# Patient Record
Sex: Male | Born: 1950 | Race: White | Hispanic: No | Marital: Married
Health system: Southern US, Community
[De-identification: ages and names within clinical notes are randomized; demographics above are authoritative.]

## PROBLEM LIST (undated history)

## (undated) HISTORY — PX: FOOT SURGERY: SHX648

## (undated) HISTORY — PX: OTHER SURGICAL HISTORY: SHX169

## (undated) HISTORY — PX: HIP FRACTURE SURGERY: SHX118

---

## 2011-11-09 ENCOUNTER — Emergency Department (HOSPITAL_COMMUNITY): Payer: BC Managed Care – PPO

## 2011-11-09 ENCOUNTER — Encounter (HOSPITAL_COMMUNITY): Payer: Self-pay | Admitting: *Deleted

## 2011-11-09 ENCOUNTER — Emergency Department (HOSPITAL_COMMUNITY)
Admission: EM | Admit: 2011-11-09 | Discharge: 2011-11-09 | Disposition: A | Discharge status: Home / Self Care | Payer: BC Managed Care – PPO | Attending: Emergency Medicine | Admitting: Emergency Medicine

## 2011-11-09 DIAGNOSIS — N1330 Unspecified hydronephrosis: Secondary | ICD-10-CM | POA: Insufficient documentation

## 2011-11-09 DIAGNOSIS — M545 Low back pain, unspecified: Secondary | ICD-10-CM | POA: Insufficient documentation

## 2011-11-09 DIAGNOSIS — R1032 Left lower quadrant pain: Secondary | ICD-10-CM | POA: Insufficient documentation

## 2011-11-09 DIAGNOSIS — R112 Nausea with vomiting, unspecified: Secondary | ICD-10-CM | POA: Insufficient documentation

## 2011-11-09 DIAGNOSIS — N201 Calculus of ureter: Secondary | ICD-10-CM | POA: Insufficient documentation

## 2011-11-09 LAB — POCT I-STAT, CHEM 8
BUN: 18 mg/dL (ref 6–23)
Calcium, Ion: 1.14 mmol/L (ref 1.12–1.23)
Creatinine, Ser: 1.1 mg/dL (ref 0.50–1.35)
Hemoglobin: 15.6 g/dL (ref 13.0–17.0)
Sodium: 139 mEq/L (ref 135–145)
TCO2: 25 mmol/L (ref 0–100)

## 2011-11-09 LAB — URINE MICROSCOPIC-ADD ON

## 2011-11-09 LAB — URINALYSIS, ROUTINE W REFLEX MICROSCOPIC
Glucose, UA: NEGATIVE mg/dL
Ketones, ur: NEGATIVE mg/dL
Leukocytes, UA: NEGATIVE
Protein, ur: NEGATIVE mg/dL
Urobilinogen, UA: 0.2 mg/dL (ref 0.0–1.0)

## 2011-11-09 MED ORDER — NAPROXEN 500 MG PO TABS: 500.0000 mg | ORAL_TABLET | Freq: Two times a day (BID) | ORAL | Status: AC

## 2011-11-09 MED ORDER — KETOROLAC TROMETHAMINE 15 MG/ML IJ SOLN
15.0000 mg | Freq: Once | INTRAMUSCULAR | Status: AC
  Administered 2011-11-09: 15 mg via INTRAVENOUS
  Filled 2011-11-09: qty 1

## 2011-11-09 MED ORDER — TAMSULOSIN HCL 0.4 MG PO CAPS: 0.4000 mg | ORAL_CAPSULE | Freq: Every day | ORAL | Status: AC

## 2011-11-09 MED ORDER — HYDROMORPHONE HCL PF 1 MG/ML IJ SOLN
1.0000 mg | Freq: Once | INTRAMUSCULAR | Status: AC
  Administered 2011-11-09: 1 mg via INTRAVENOUS

## 2011-11-09 MED ORDER — ONDANSETRON HCL 4 MG/2ML IJ SOLN
4.0000 mg | Freq: Once | INTRAMUSCULAR | Status: AC
  Administered 2011-11-09: 4 mg via INTRAVENOUS

## 2011-11-09 MED ORDER — OXYCODONE-ACETAMINOPHEN 5-325 MG PO TABS: 1.0000 | ORAL_TABLET | Freq: Four times a day (QID) | ORAL | Status: AC | PRN

## 2011-11-09 NOTE — ED Provider Notes (Signed)
History     CSN: [id number]  Arrival date & time 11/09/11  4098   First MD Initiated Contact with Patient 11/09/11 0630      Chief Complaint  Patient presents with  . Abdominal Pain    (Consider location/radiation/quality/duration/timing/severity/associated sxs/prior treatment) HPI Comments: Patient presents with complaint of left lower back pain that began acutely at 0330. Pain described as sharp and constant. Since that time the pain as moved to L lower abdomen. No pain in L groin. Pain has been associated with N/V. Vomit is non-bloody, non-bilious. No treatments prior to arrival. No fever, diarrhea. No h/o kidney stones, diverticulitis. No h/o abdominal surgery. Nothing makes symptoms better or worse.   The history is provided by the patient.    History reviewed. No pertinent past medical history.  Past Surgical History  Procedure Date  . Hip fracture surgery   . Foot surgery   . Arm surgery     History reviewed. No pertinent family history.  History  Substance Use Topics  . Smoking status: Not on file  . Smokeless tobacco: Not on file  . Alcohol Use:      ocassoinally      Review of Systems  Constitutional: Negative for fever.  HENT: Negative for sore throat and rhinorrhea.   Eyes: Negative for redness.  Respiratory: Negative for cough.   Cardiovascular: Negative for chest pain.  Gastrointestinal: Positive for nausea and vomiting. Negative for abdominal pain and diarrhea.  Genitourinary: Positive for flank pain. Negative for dysuria.  Musculoskeletal: Positive for back pain. Negative for myalgias.  Skin: Negative for rash.  Neurological: Negative for headaches.    Allergies  Review of patient's allergies indicates no known allergies.  Home Medications   Current Outpatient Rx  Name Route Sig Dispense Refill  . ADULT MULTIVITAMIN W/MINERALS CH Oral Take 1 tablet by mouth daily.      BP 151/92  Temp 97.9 F (36.6 C) (Oral)  Resp 18  Ht 5\' 10"   (1.778 m)  Wt 172 lb (78.019 kg)  BMI 24.68 kg/m2  SpO2 98%  Physical Exam  Nursing note and vitals reviewed. Constitutional: He appears well-developed and well-nourished.       Patient appears uncomfortable. Unable to sit still.   HENT:  Head: Normocephalic and atraumatic.  Eyes: Conjunctivae normal are normal. Right eye exhibits no discharge. Left eye exhibits no discharge.  Neck: Normal range of motion. Neck supple.  Cardiovascular: Normal rate, regular rhythm and normal heart sounds.   Pulmonary/Chest: Effort normal and breath sounds normal.  Abdominal: Soft. There is no tenderness. There is no rebound, no guarding and no CVA tenderness.  Neurological: He is alert.  Skin: Skin is warm and dry.  Psychiatric: He has a normal mood and affect.    ED Course  Procedures (including critical care time)  Labs Reviewed  URINALYSIS, ROUTINE W REFLEX MICROSCOPIC - Abnormal; Notable for the following:    Color, Urine AMBER (*)  BIOCHEMICALS MAY BE AFFECTED BY COLOR   APPearance HAZY (*)     Specific Gravity, Urine 1.031 (*)     Hgb urine dipstick LARGE (*)     All other components within normal limits  POCT I-STAT, CHEM 8 - Abnormal; Notable for the following:    Glucose, Bld 146 (*)     All other components within normal limits  URINE MICROSCOPIC-ADD ON - Abnormal; Notable for the following:    Crystals CA OXALATE CRYSTALS (*)     All other components within  normal limits   Ct Abdomen Pelvis Wo Contrast  11/09/2011  *RADIOLOGY REPORT*  Clinical Data: Left flank pain.  CT ABDOMEN AND PELVIS WITHOUT CONTRAST  Technique:  Multidetector CT imaging of the abdomen and pelvis was performed following the standard protocol without intravenous contrast.  Comparison: None.  Findings: Mild dependent atelectasis is seen in the lung bases. There is no pleural or pericardial effusion.  The patient has mild left hydronephrosis due to a 0.4 cm stone just proximal to the left UVJ.  No other urinary  tract stones are identified on the right or left.  The gallbladder, liver, spleen, adrenal glands and pancreas appear normal.  The stomach, small and large bowel and appendix appear normal.  There is no lymphadenopathy or fluid.  Bones demonstrate a remote appearing mild superior endplate compression fracture of L1.  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  There is 0.4 cm of anterolisthesis of L4 on L5 and 0.6 cm of anterolisthesis of L5 on S1.  Fixation of an old healed right hip fracture with three screws noted.  IMPRESSION:  1.  Mild left hydronephrosis due to a 0.4 cm distal left ureteral stone. 2.  Bilateral pars particulars defects L3, L4 and L5.  Mild anterolisthesis of L4 on L5 and L5 on S1 noted. 4.  Remote appearing T12 superior endplate compression fracture.   Original Report Authenticated By: Bernadene Bell. D'ALESSIO, M.D.      1. Ureteral stone     6:55 AM Patient seen and examined. Work-up initiated. Medications ordered. CT scan ordered to evaluate L ureteral stone.   Vital signs reviewed and are as follows: Filed Vitals:   11/09/11 0627  BP: 151/92  Temp: 97.9 F (36.6 C)  Resp: 18   7:56 AM Mild left hydronephrosis due to 4mm distal left ureteral stone. Pt informed. Toradol ordered. No contraindications (GI bleed, renal problems, urgent need for urological intervention). Crt 1.1.   Pain improved after dilaudid but states pain returning.   10:00 AM Toradol given, patient feels better. Awaiting UA. Patient has yet to urinate. He is drinking fluids.   12:06 PM UA reviewed. No signs of infection. On reexam patient has complete resolution of pain and he is feeling well. Will discharge to home. Patient urged to return to [place] long if he feels the need to return to the emergency department for pain.  Patient counseled on use of narcotic pain medications. Counseled not to combine these medications with others containing tylenol. Urged not to drink  alcohol, drive, or perform any other activities that requires focus while taking these medications. The patient verbalizes understanding and agrees with the plan.    MDM  4mm L ureteral stone, mild hydronephrosis. No UTI. Pain controlled. Appropriate for outpatient treatment and follow-up.         Renne Crigler, [place] 11/09/11 1209

## 2011-11-09 NOTE — ED Notes (Signed)
Patient stated he was driving to work and started with left lower back pain and nausea.  The pain has traveled around to the left lower abd area and now has been vomiting in a bag.

## 2011-11-09 NOTE — ED Notes (Signed)
Patient presents with c/o lower back pain that has traveled around the front to the lower abd area.  Denies pain in groin, no difficulty urinating.  +heaving

## 2011-11-09 NOTE — ED Notes (Signed)
Pt returned from CT °

## 2011-11-09 NOTE — ED Notes (Signed)
Josh, PA back at the bedside.  

## 2011-11-11 NOTE — ED Provider Notes (Signed)
Medical screening examination/treatment/procedure(s) were performed by non-physician practitioner and as supervising physician I was immediately available for consultation/collaboration.   Alano Blasco L Tishia Maestre, MD 11/11/11 1619 

## 2014-04-13 IMAGING — CT CT ABD-PELV W/O CM
2 of 4 series · 17 of 46 positions shown, 19 images · non-contrast
Comparison: None.

CLINICAL DATA: Left flank pain.

CT ABDOMEN AND PELVIS WITHOUT CONTRAST
TECHNIQUE: Multidetector CT imaging of the abdomen and pelvis was
performed following the standard protocol without intravenous
contrast.

[Series 2: stone 160 5.0 b31f st · axial · 0.76mm/px · z∈[+829,+1279]mm · 14 of 100 slices shown, 16 images]
[im 5/100  soft-tissue]
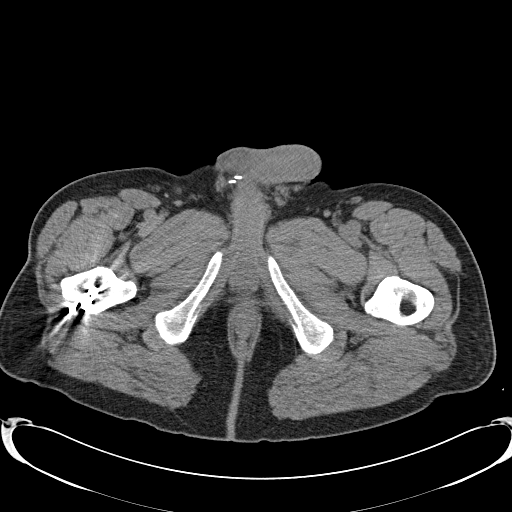
[im 5/100  bone]
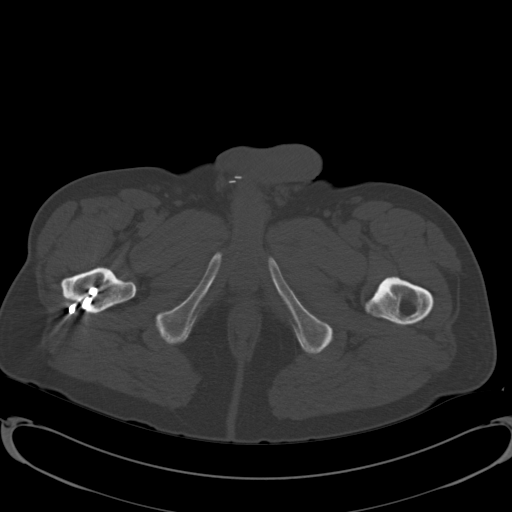
[im 13/100  soft-tissue]
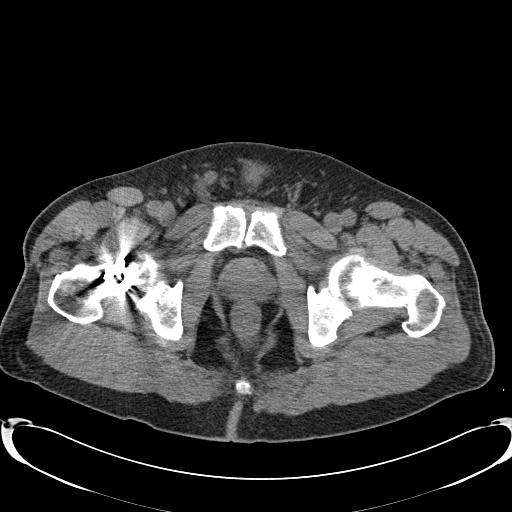
[im 18/100  soft-tissue]
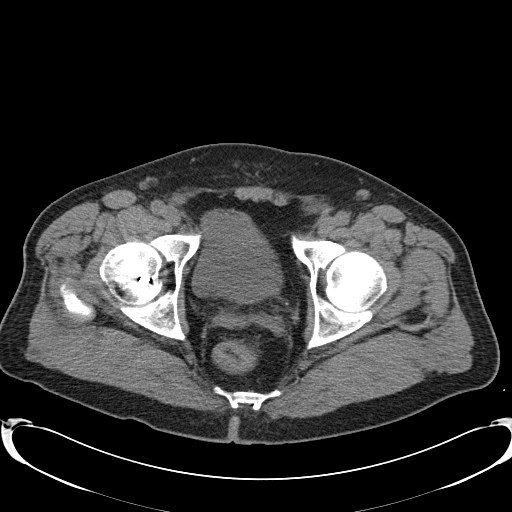
[im 26/100  soft-tissue]
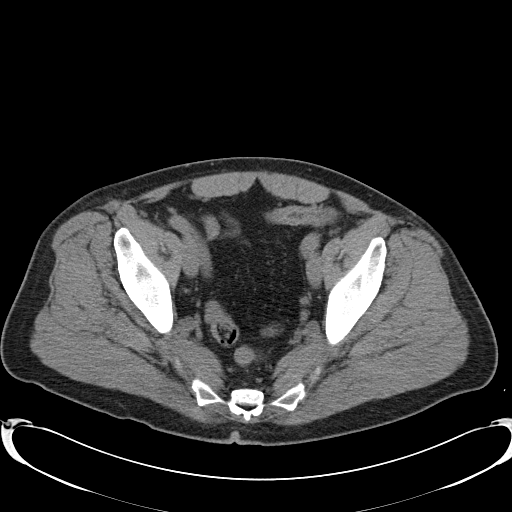
[im 35/100  soft-tissue]
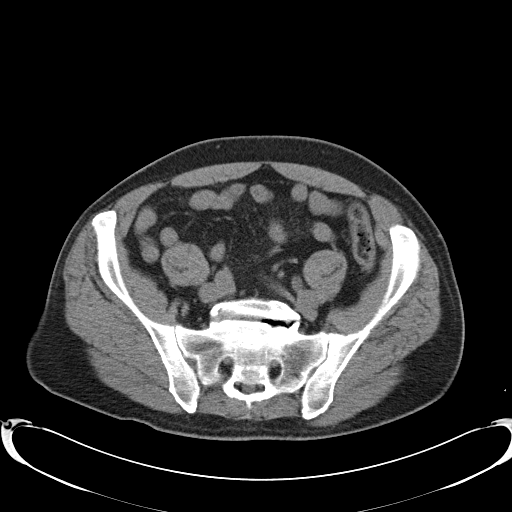
[im 39/100  soft-tissue]
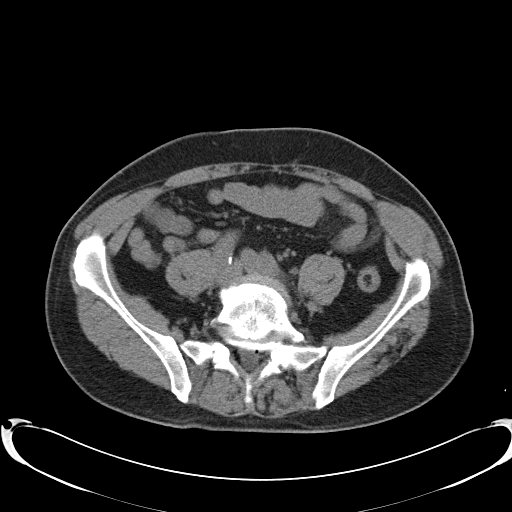
[im 48/100  soft-tissue]
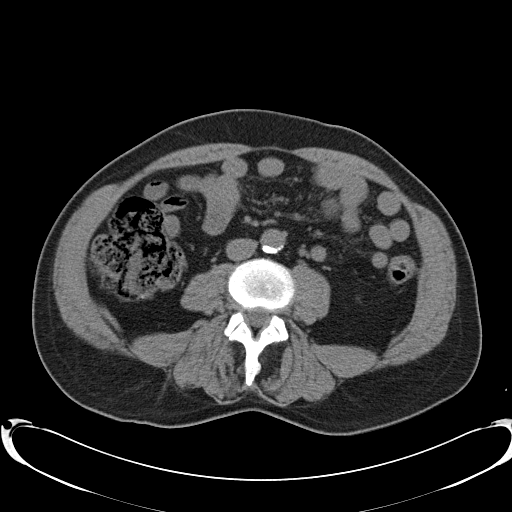
[im 52/100  soft-tissue]
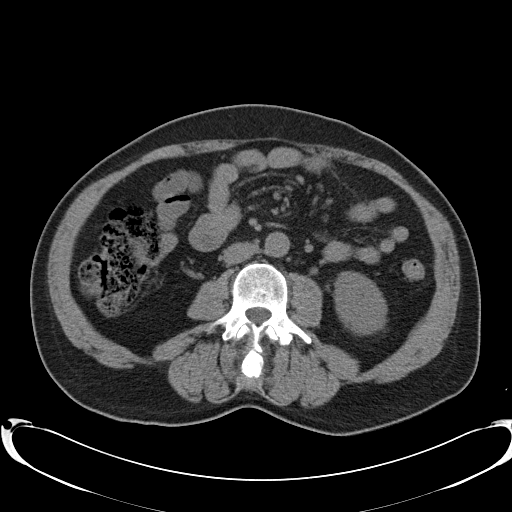
[im 61/100  soft-tissue]
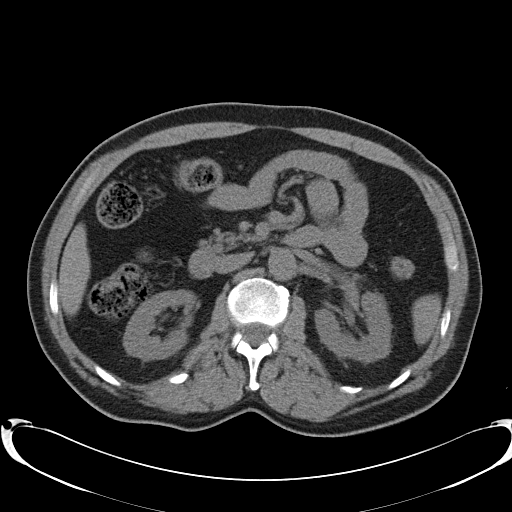
[im 61/100  bone]
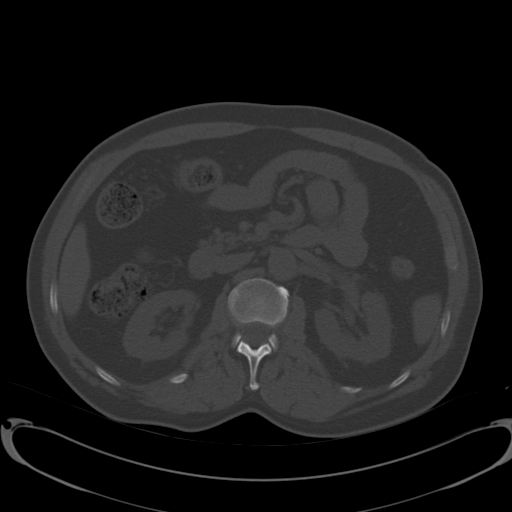
[im 65/100  soft-tissue]
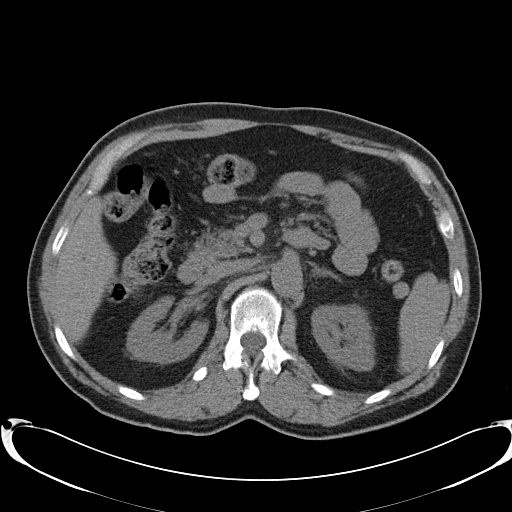
[im 74/100  soft-tissue]
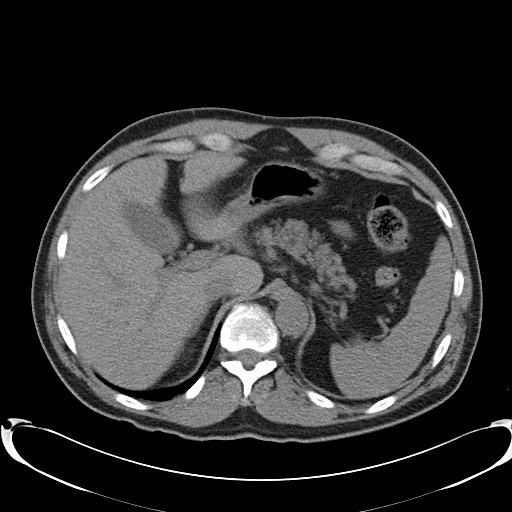
[im 82/100  soft-tissue]
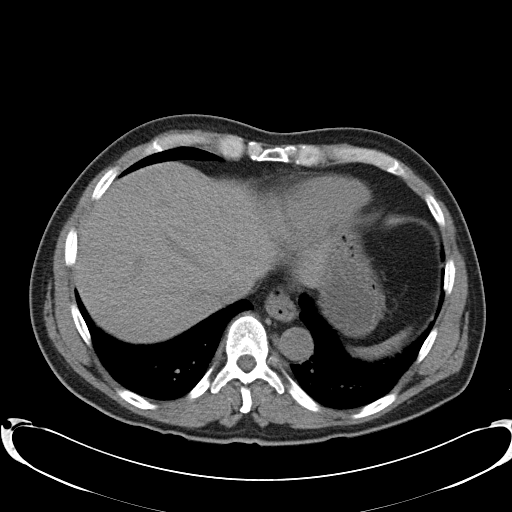
[im 87/100  soft-tissue]
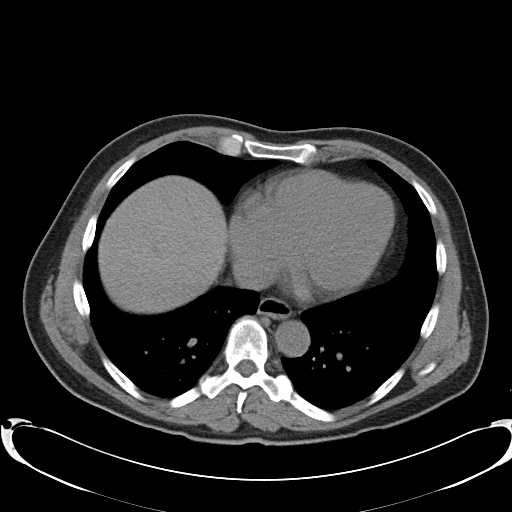
[im 95/100  soft-tissue]
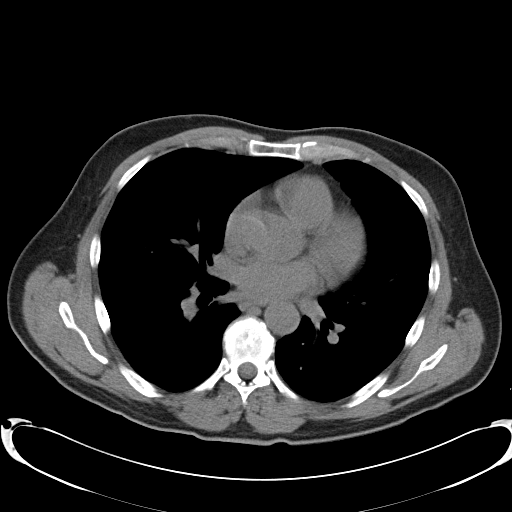

[Series 5: coronals · coronal · 0.94mm/px · 3 of 115 slices shown]
[im 39/115  soft-tissue]
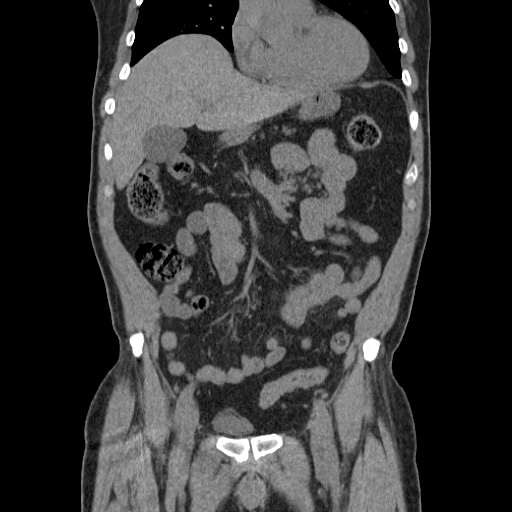
[im 51/115  soft-tissue]
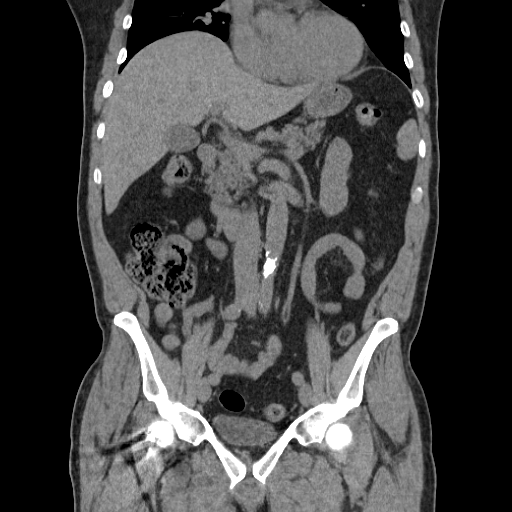
[im 64/115  soft-tissue]
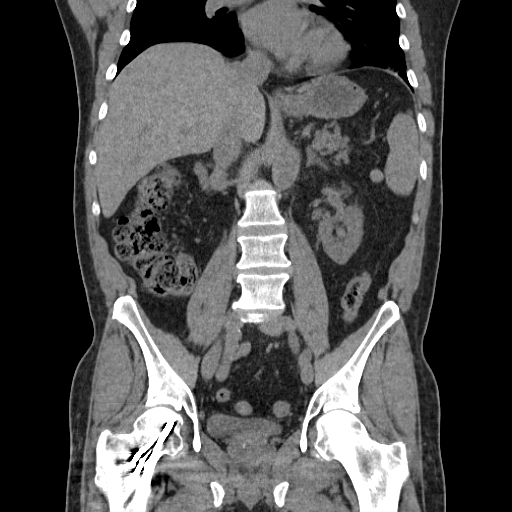

[17 of 46 positions shown; findings below may reference images not displayed]

FINDINGS: Mild dependent atelectasis is seen in the lung bases.
There is no pleural or pericardial effusion.

The patient has mild left hydronephrosis due to a 0.4 cm stone just
proximal to the left UVJ.  No other urinary tract stones are
identified on the right or left.

The gallbladder, liver, spleen, adrenal glands and pancreas appear
normal.  The stomach, small and large bowel and appendix appear
normal.  There is no lymphadenopathy or fluid.

Bones demonstrate a remote appearing mild superior endplate
compression fracture of L1.  The patient has pars interarticularis
defects bilaterally at L3, L4 and L5.  There is 0.4 cm of
anterolisthesis of L4 on L5 and 0.6 cm of anterolisthesis of L5 on
S1.  Fixation of an old healed right hip fracture with three screws
noted.
IMPRESSION: 1.  Mild left hydronephrosis due to a 0.4 cm distal left ureteral
stone.
2.  Bilateral pars particulars defects L3, L4 and L5.  Mild
anterolisthesis of L4 on L5 and L5 on S1 noted.
4.  Remote appearing T12 superior endplate compression fracture.

## 2019-04-12 ENCOUNTER — Ambulatory Visit: Payer: Self-pay | Attending: Internal Medicine

## 2019-04-12 DIAGNOSIS — Z23 Encounter for immunization: Secondary | ICD-10-CM | POA: Insufficient documentation

## 2019-04-12 NOTE — Progress Notes (Signed)
   Covid-19 Vaccination Clinic  Name:  Louis Stephenson    MRN: [id number] DOB: 03/17/1950  04/12/2019  Louis Stephenson was observed post Covid-19 immunization for 15 minutes without incident. He was provided with Vaccine Information Sheet and instruction to access the V-Safe system.   Louis Stephenson was instructed to call 911 with any severe reactions post vaccine: Marland Kitchen Difficulty breathing  . Swelling of face and throat  . A fast heartbeat  . A bad rash all over body  . Dizziness and weakness   Immunizations Administered    Name Date Dose VIS Date Route   Pfizer COVID-19 Vaccine 04/12/2019 12:17 PM 0.3 mL 01/17/2019 Intramuscular   Manufacturer: [employer], [employer]   Lot: [id number]   NDC: [address]-9

## 2019-05-13 ENCOUNTER — Ambulatory Visit: Payer: Self-pay | Attending: Internal Medicine

## 2019-05-13 DIAGNOSIS — Z23 Encounter for immunization: Secondary | ICD-10-CM

## 2019-05-13 NOTE — Progress Notes (Signed)
   Covid-19 Vaccination Clinic  Name:  Louis Stephenson    MRN: [id number] DOB: 05-23-1950  05/13/2019  Louis Stephenson was observed post Covid-19 immunization for 15 minutes without incident. He was provided with Vaccine Information Sheet and instruction to access the V-Safe system.   Louis Stephenson was instructed to call 911 with any severe reactions post vaccine: Marland Kitchen Difficulty breathing  . Swelling of face and throat  . A fast heartbeat  . A bad rash all over body  . Dizziness and weakness   Immunizations Administered    Name Date Dose VIS Date Route   Pfizer COVID-19 Vaccine 05/13/2019  3:46 PM 0.3 mL 01/17/2019 Intramuscular   Manufacturer: [employer], [employer]   Lot: [id number]   NDC: [address]-7
